# Patient Record
Sex: Male | Born: 1980 | Race: White | Hispanic: No | Marital: Married | Smoking: Never smoker
Health system: Southern US, Community
[De-identification: ages and names within clinical notes are randomized; demographics above are authoritative.]

## PROBLEM LIST (undated history)

## (undated) DIAGNOSIS — T4145XA Adverse effect of unspecified anesthetic, initial encounter: Secondary | ICD-10-CM

## (undated) DIAGNOSIS — Z8489 Family history of other specified conditions: Secondary | ICD-10-CM

## (undated) DIAGNOSIS — T8859XA Other complications of anesthesia, initial encounter: Secondary | ICD-10-CM

## (undated) DIAGNOSIS — S86019A Strain of unspecified Achilles tendon, initial encounter: Secondary | ICD-10-CM

## (undated) HISTORY — PX: FINGER SURGERY: SHX640

## (undated) HISTORY — PX: ANKLE RECONSTRUCTION: SHX1151

## (undated) HISTORY — PX: HAND LIGAMENT RECONSTRUCTION: SHX1726

## (undated) HISTORY — PX: SHOULDER ARTHROSCOPY: SHX128

---

## 1998-09-20 ENCOUNTER — Emergency Department (HOSPITAL_COMMUNITY): Admission: EM | Admit: 1998-09-20 | Discharge: 1998-09-20 | Payer: Self-pay | Admitting: Emergency Medicine

## 1998-09-20 ENCOUNTER — Emergency Department (HOSPITAL_COMMUNITY): Admission: EM | Admit: 1998-09-20 | Discharge: 1998-09-20 | Payer: Self-pay | Admitting: *Deleted

## 2002-06-21 ENCOUNTER — Ambulatory Visit (HOSPITAL_BASED_OUTPATIENT_CLINIC_OR_DEPARTMENT_OTHER): Admission: RE | Admit: 2002-06-21 | Discharge: 2002-06-21 | Payer: Self-pay | Admitting: Orthopedic Surgery

## 2004-09-06 ENCOUNTER — Encounter: Admission: RE | Admit: 2004-09-06 | Discharge: 2004-09-06 | Payer: Self-pay | Admitting: Emergency Medicine

## 2006-01-31 HISTORY — PX: SHOULDER ARTHROSCOPY WITH LABRAL REPAIR: SHX5691

## 2006-08-12 IMAGING — CR DG CHEST 2V
2 series · 2 of 2 positions shown · non-contrast
Comparison: Report dated 09/20/1998.

CLINICAL DATA: Right chest pain for the past 8 hours. Previous pneumothorax.

CHEST - 2 VIEW

[view not recorded (1 of 2)]
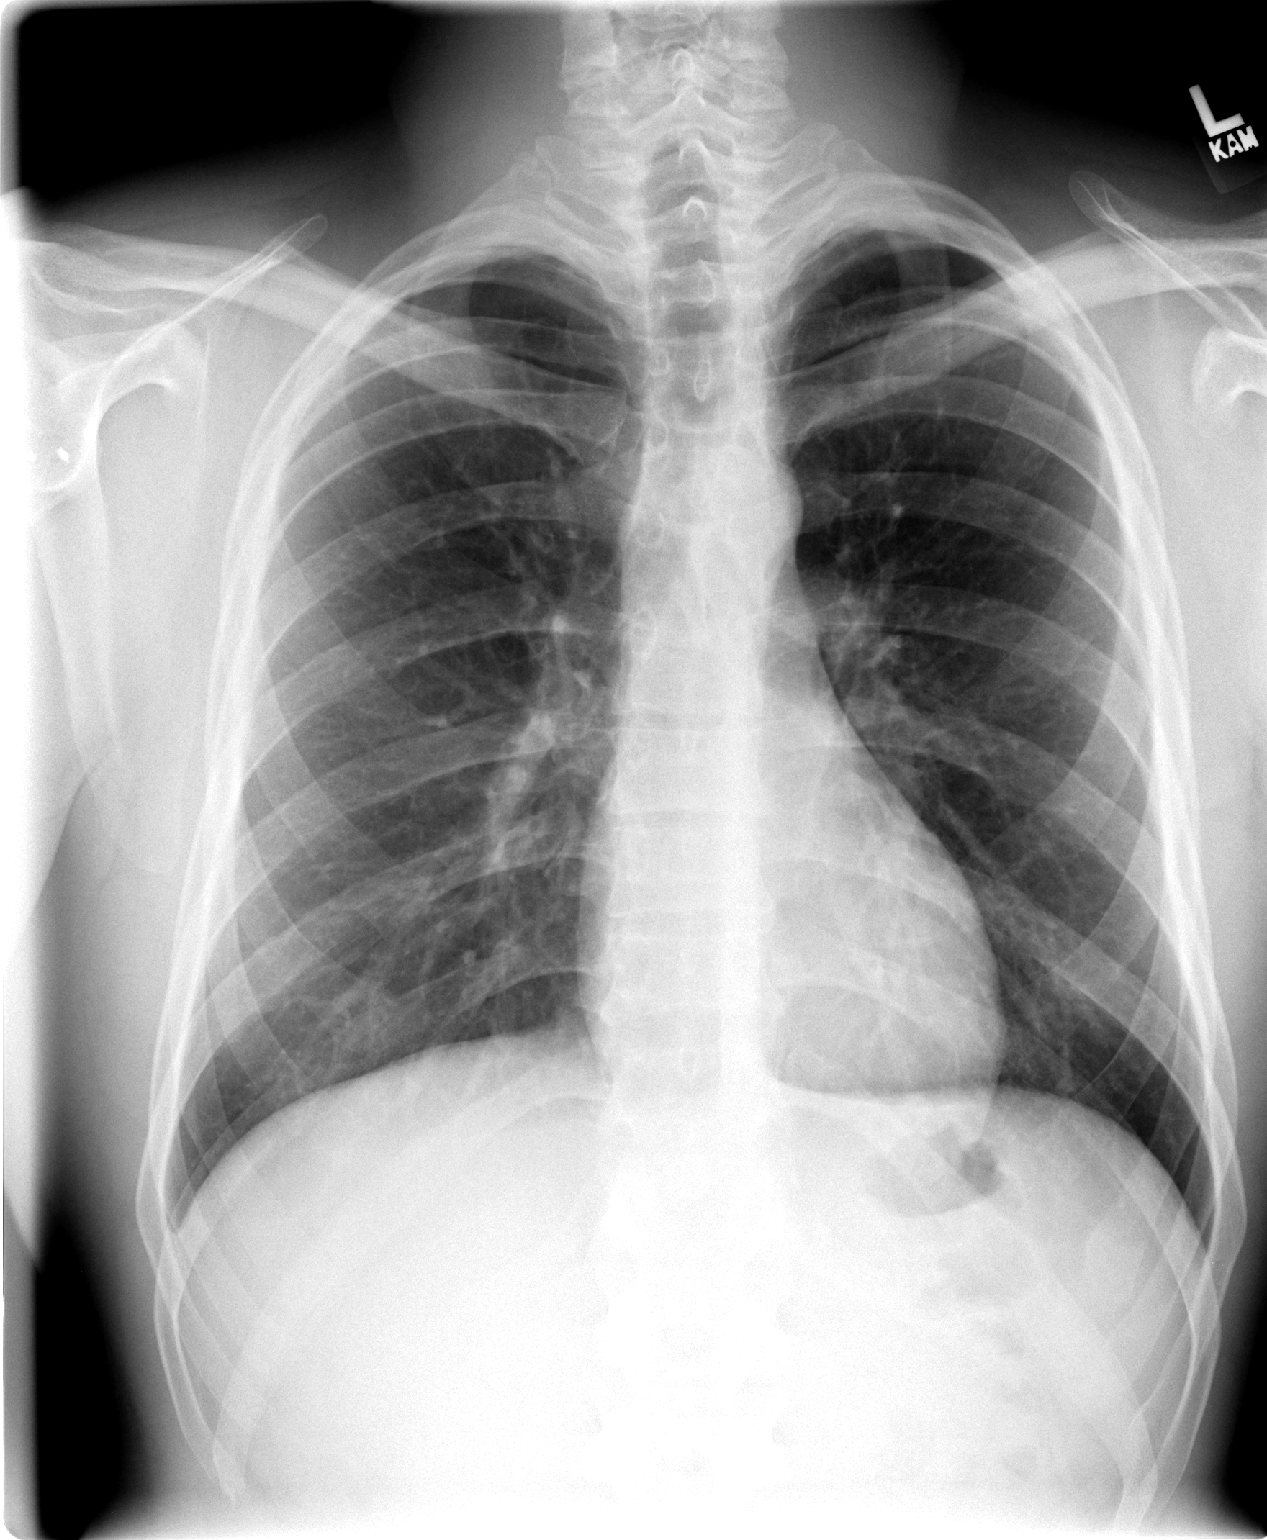

[view not recorded (2 of 2)]
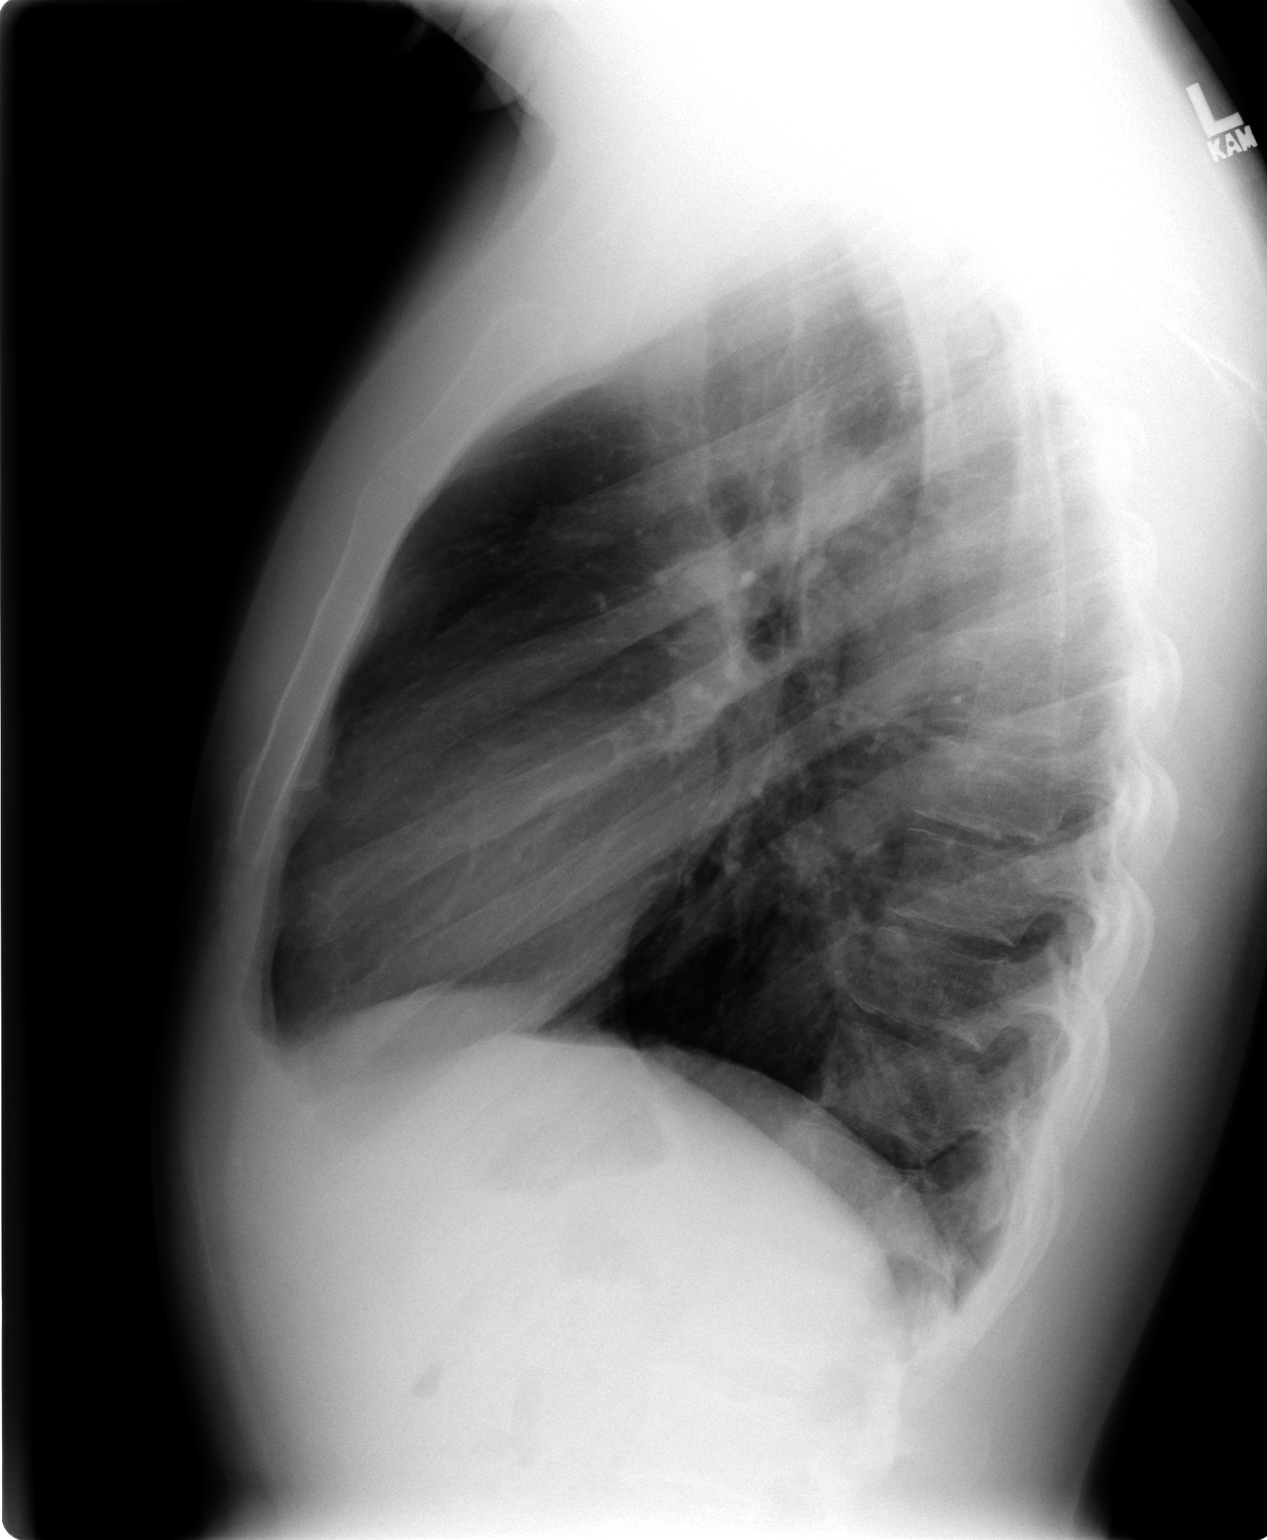

[2 of 2 positions shown; findings below may reference images not displayed]

FINDINGS: Normal sized heart. Clear lungs. No pneumothorax or pleural fluid.
Three small metallic densities or artifacts overlying the right shoulder.
Minimal scoliosis.

IMPRESSION

No acute abnormality.

## 2014-11-01 DIAGNOSIS — S86019A Strain of unspecified Achilles tendon, initial encounter: Secondary | ICD-10-CM

## 2014-11-01 HISTORY — DX: Strain of unspecified achilles tendon, initial encounter: S86.019A

## 2014-11-17 ENCOUNTER — Other Ambulatory Visit: Payer: Self-pay | Admitting: Orthopedic Surgery

## 2014-11-18 ENCOUNTER — Encounter (HOSPITAL_BASED_OUTPATIENT_CLINIC_OR_DEPARTMENT_OTHER): Payer: Self-pay | Admitting: *Deleted

## 2014-11-19 ENCOUNTER — Ambulatory Visit (HOSPITAL_BASED_OUTPATIENT_CLINIC_OR_DEPARTMENT_OTHER)
Admission: RE | Admit: 2014-11-19 | Discharge: 2014-11-19 | Disposition: A | Discharge status: Home / Self Care | Payer: Medicaid Other | Source: Ambulatory Visit | Attending: Orthopedic Surgery | Admitting: Orthopedic Surgery

## 2014-11-19 ENCOUNTER — Encounter (HOSPITAL_BASED_OUTPATIENT_CLINIC_OR_DEPARTMENT_OTHER)
Admission: RE | Disposition: A | Discharge status: Home / Self Care | Payer: Self-pay | Source: Ambulatory Visit | Attending: Orthopedic Surgery

## 2014-11-19 ENCOUNTER — Ambulatory Visit (HOSPITAL_BASED_OUTPATIENT_CLINIC_OR_DEPARTMENT_OTHER): Payer: Medicaid Other | Admitting: Anesthesiology

## 2014-11-19 ENCOUNTER — Encounter (HOSPITAL_BASED_OUTPATIENT_CLINIC_OR_DEPARTMENT_OTHER): Payer: Self-pay | Admitting: *Deleted

## 2014-11-19 DIAGNOSIS — M66871 Spontaneous rupture of other tendons, right ankle and foot: Secondary | ICD-10-CM | POA: Diagnosis not present

## 2014-11-19 HISTORY — DX: Strain of unspecified achilles tendon, initial encounter: S86.019A

## 2014-11-19 HISTORY — DX: Family history of other specified conditions: Z84.89

## 2014-11-19 HISTORY — DX: Other complications of anesthesia, initial encounter: T88.59XA

## 2014-11-19 HISTORY — PX: ACHILLES TENDON SURGERY: SHX542

## 2014-11-19 HISTORY — DX: Adverse effect of unspecified anesthetic, initial encounter: T41.45XA

## 2014-11-19 SURGERY — REPAIR, TENDON, ACHILLES
Anesthesia: General | Site: Ankle | Laterality: Right

## 2014-11-19 MED ORDER — CHLORHEXIDINE GLUCONATE 4 % EX LIQD: 60.0000 mL | Freq: Once | CUTANEOUS | Status: DC

## 2014-11-19 MED ORDER — GLYCOPYRROLATE 0.2 MG/ML IJ SOLN
0.2000 mg | Freq: Once | INTRAMUSCULAR | Status: AC | PRN
  Administered 2014-11-19: 0.2 mg via INTRAVENOUS

## 2014-11-19 MED ORDER — CEFAZOLIN SODIUM-DEXTROSE 2-3 GM-% IV SOLR
INTRAVENOUS | Status: AC
  Filled 2014-11-19: qty 50

## 2014-11-19 MED ORDER — FENTANYL CITRATE (PF) 100 MCG/2ML IJ SOLN
INTRAMUSCULAR | Status: AC
  Filled 2014-11-19: qty 4

## 2014-11-19 MED ORDER — PROPOFOL 10 MG/ML IV BOLUS
INTRAVENOUS | Status: DC | PRN
  Administered 2014-11-19: 200 mg via INTRAVENOUS

## 2014-11-19 MED ORDER — LACTATED RINGERS IV SOLN
INTRAVENOUS | Status: DC
  Administered 2014-11-19 (×2): via INTRAVENOUS

## 2014-11-19 MED ORDER — LIDOCAINE HCL (CARDIAC) 10 MG/ML IV SOLN
INTRAVENOUS | Status: DC | PRN
  Administered 2014-11-19: 40 mg via INTRAVENOUS

## 2014-11-19 MED ORDER — ONDANSETRON HCL 4 MG/2ML IJ SOLN: 4.0000 mg | Freq: Once | INTRAMUSCULAR | Status: DC | PRN

## 2014-11-19 MED ORDER — BUPIVACAINE-EPINEPHRINE (PF) 0.5% -1:200000 IJ SOLN
INTRAMUSCULAR | Status: DC | PRN
  Administered 2014-11-19: 25 mL

## 2014-11-19 MED ORDER — ONDANSETRON HCL 4 MG/2ML IJ SOLN
INTRAMUSCULAR | Status: DC | PRN
  Administered 2014-11-19: 4 mg via INTRAVENOUS

## 2014-11-19 MED ORDER — MIDAZOLAM HCL 2 MG/2ML IJ SOLN
INTRAMUSCULAR | Status: AC
  Filled 2014-11-19: qty 2

## 2014-11-19 MED ORDER — FENTANYL CITRATE (PF) 100 MCG/2ML IJ SOLN: 25.0000 ug | INTRAMUSCULAR | Status: DC | PRN

## 2014-11-19 MED ORDER — LIDOCAINE HCL (CARDIAC) 20 MG/ML IV SOLN
INTRAVENOUS | Status: AC
  Filled 2014-11-19: qty 5

## 2014-11-19 MED ORDER — CEFAZOLIN SODIUM-DEXTROSE 2-3 GM-% IV SOLR
2.0000 g | INTRAVENOUS | Status: AC
  Administered 2014-11-19: 2 g via INTRAVENOUS

## 2014-11-19 MED ORDER — SUCCINYLCHOLINE CHLORIDE 20 MG/ML IJ SOLN
INTRAMUSCULAR | Status: DC | PRN
  Administered 2014-11-19: 100 mg via INTRAVENOUS

## 2014-11-19 MED ORDER — FENTANYL CITRATE (PF) 100 MCG/2ML IJ SOLN
INTRAMUSCULAR | Status: AC
  Filled 2014-11-19: qty 2

## 2014-11-19 MED ORDER — MIDAZOLAM HCL 2 MG/2ML IJ SOLN
INTRAMUSCULAR | Status: AC
  Filled 2014-11-19: qty 4

## 2014-11-19 MED ORDER — ARTIFICIAL TEARS OP OINT
TOPICAL_OINTMENT | OPHTHALMIC | Status: AC
  Filled 2014-11-19: qty 3.5

## 2014-11-19 MED ORDER — FENTANYL CITRATE (PF) 100 MCG/2ML IJ SOLN
50.0000 ug | INTRAMUSCULAR | Status: AC | PRN
  Administered 2014-11-19: 25 ug via INTRAVENOUS
  Administered 2014-11-19: 100 ug via INTRAVENOUS
  Administered 2014-11-19: 25 ug via INTRAVENOUS
  Administered 2014-11-19: 100 ug via INTRAVENOUS

## 2014-11-19 MED ORDER — MIDAZOLAM HCL 2 MG/2ML IJ SOLN
1.0000 mg | INTRAMUSCULAR | Status: DC | PRN
  Administered 2014-11-19 (×2): 2 mg via INTRAVENOUS

## 2014-11-19 MED ORDER — CEFAZOLIN SODIUM 1-5 GM-% IV SOLN
INTRAVENOUS | Status: AC
  Filled 2014-11-19: qty 50

## 2014-11-19 MED ORDER — DEXAMETHASONE SODIUM PHOSPHATE 10 MG/ML IJ SOLN
INTRAMUSCULAR | Status: AC
  Filled 2014-11-19: qty 1

## 2014-11-19 MED ORDER — SCOPOLAMINE 1 MG/3DAYS TD PT72: 1.0000 | MEDICATED_PATCH | Freq: Once | TRANSDERMAL | Status: DC | PRN

## 2014-11-19 MED ORDER — OXYCODONE-ACETAMINOPHEN 5-325 MG PO TABS: 1.0000 | ORAL_TABLET | Freq: Four times a day (QID) | ORAL | Status: AC | PRN

## 2014-11-19 MED ORDER — CEFAZOLIN SODIUM 1-5 GM-% IV SOLN
1.0000 g | Freq: Once | INTRAVENOUS | Status: AC
  Administered 2014-11-19: 1 g via INTRAVENOUS

## 2014-11-19 MED ORDER — ONDANSETRON HCL 4 MG/2ML IJ SOLN
INTRAMUSCULAR | Status: AC
  Filled 2014-11-19: qty 2

## 2014-11-19 MED ORDER — DEXAMETHASONE SODIUM PHOSPHATE 4 MG/ML IJ SOLN
INTRAMUSCULAR | Status: DC | PRN
  Administered 2014-11-19: 10 mg via INTRAVENOUS

## 2014-11-19 MED ORDER — KETOROLAC TROMETHAMINE 30 MG/ML IJ SOLN
INTRAMUSCULAR | Status: AC
  Filled 2014-11-19: qty 1

## 2014-11-19 SURGICAL SUPPLY — 65 items
BANDAGE ELASTIC 4 VELCRO ST LF (GAUZE/BANDAGES/DRESSINGS) ×3 IMPLANT
BANDAGE ELASTIC 6 VELCRO ST LF (GAUZE/BANDAGES/DRESSINGS) ×3 IMPLANT
BANDAGE ESMARK 6X9 LF (GAUZE/BANDAGES/DRESSINGS) ×1 IMPLANT
BLADE SURG 10 STRL SS (BLADE) ×3 IMPLANT
BLADE SURG 15 STRL LF DISP TIS (BLADE) ×1 IMPLANT
BLADE SURG 15 STRL SS (BLADE) ×2
BNDG ESMARK 4X9 LF (GAUZE/BANDAGES/DRESSINGS) IMPLANT
BNDG ESMARK 6X9 LF (GAUZE/BANDAGES/DRESSINGS) ×3
COVER BACK TABLE 60X90IN (DRAPES) ×3 IMPLANT
DECANTER SPIKE VIAL GLASS SM (MISCELLANEOUS) IMPLANT
DRAPE EXTREMITY T 121X128X90 (DRAPE) ×3 IMPLANT
DRAPE U 20/CS (DRAPES) ×3 IMPLANT
DRAPE U-SHAPE 47X51 STRL (DRAPES) ×3 IMPLANT
DURAPREP 26ML APPLICATOR (WOUND CARE) ×3 IMPLANT
ELECT REM PT RETURN 9FT ADLT (ELECTROSURGICAL) ×3
ELECTRODE REM PT RTRN 9FT ADLT (ELECTROSURGICAL) ×1 IMPLANT
GAUZE SPONGE 4X4 12PLY STRL (GAUZE/BANDAGES/DRESSINGS) ×3 IMPLANT
GAUZE XEROFORM 1X8 LF (GAUZE/BANDAGES/DRESSINGS) ×3 IMPLANT
GLOVE BIO SURGEON STRL SZ 6.5 (GLOVE) ×4 IMPLANT
GLOVE BIO SURGEONS STRL SZ 6.5 (GLOVE) ×2
GLOVE BIOGEL PI IND STRL 7.0 (GLOVE) ×1 IMPLANT
GLOVE BIOGEL PI IND STRL 8 (GLOVE) ×2 IMPLANT
GLOVE BIOGEL PI INDICATOR 7.0 (GLOVE) ×2
GLOVE BIOGEL PI INDICATOR 8 (GLOVE) ×4
GLOVE ECLIPSE 7.5 STRL STRAW (GLOVE) ×6 IMPLANT
GOWN STRL REUS W/ TWL LRG LVL3 (GOWN DISPOSABLE) ×1 IMPLANT
GOWN STRL REUS W/ TWL XL LVL3 (GOWN DISPOSABLE) ×1 IMPLANT
GOWN STRL REUS W/TWL LRG LVL3 (GOWN DISPOSABLE) ×2
GOWN STRL REUS W/TWL XL LVL3 (GOWN DISPOSABLE) ×8 IMPLANT
NDL SUT 6 .5 CRC .975X.05 MAYO (NEEDLE) ×1 IMPLANT
NEEDLE HYPO 25X1 1.5 SAFETY (NEEDLE) IMPLANT
NEEDLE MAYO TAPER (NEEDLE) ×2
NS IRRIG 1000ML POUR BTL (IV SOLUTION) ×3 IMPLANT
PAD CAST 4YDX4 CTTN HI CHSV (CAST SUPPLIES) ×2 IMPLANT
PADDING CAST ABS 4INX4YD NS (CAST SUPPLIES) ×2
PADDING CAST ABS COTTON 4X4 ST (CAST SUPPLIES) ×1 IMPLANT
PADDING CAST COTTON 4X4 STRL (CAST SUPPLIES) ×4
PASSER SUT SWANSON 36MM LOOP (INSTRUMENTS) IMPLANT
PENCIL BUTTON HOLSTER BLD 10FT (ELECTRODE) ×3 IMPLANT
SPLINT FAST PLASTER 5X30 (CAST SUPPLIES) ×4
SPLINT FIBERGLASS 4X30 (CAST SUPPLIES) ×3 IMPLANT
SPLINT PLASTER CAST FAST 5X30 (CAST SUPPLIES) ×2 IMPLANT
SPONGE LAP 4X18 X RAY DECT (DISPOSABLE) ×3 IMPLANT
STOCKINETTE 6  STRL (DRAPES) ×2
STOCKINETTE 6 STRL (DRAPES) ×1 IMPLANT
SUCTION FRAZIER TIP 10 FR DISP (SUCTIONS) IMPLANT
SUT ETHIBOND 0 MO6 C/R (SUTURE) IMPLANT
SUT ETHIBOND 2 OS 4 DA (SUTURE) IMPLANT
SUT ETHILON 3 0 PS 1 (SUTURE) IMPLANT
SUT FIBERWIRE #2 38 T-5 BLUE (SUTURE) ×3
SUT FIBERWIRE #5 38 CONV NDL (SUTURE)
SUT VIC AB 2-0 CT1 27 (SUTURE) ×2
SUT VIC AB 2-0 CT1 TAPERPNT 27 (SUTURE) ×1 IMPLANT
SUT VIC AB 3-0 FS2 27 (SUTURE) ×3 IMPLANT
SUT VICRYL 4-0 PS2 18IN ABS (SUTURE) ×3 IMPLANT
SUTURE FIBERWR #2 38 T-5 BLUE (SUTURE) ×1 IMPLANT
SUTURE FIBERWR #5 38 CONV NDL (SUTURE) IMPLANT
SYR BULB 3OZ (MISCELLANEOUS) ×3 IMPLANT
SYR CONTROL 10ML LL (SYRINGE) IMPLANT
TOWEL OR 17X24 6PK STRL BLUE (TOWEL DISPOSABLE) ×6 IMPLANT
TOWEL OR NON WOVEN STRL DISP B (DISPOSABLE) ×3 IMPLANT
TUBE CONNECTING 20'X1/4 (TUBING)
TUBE CONNECTING 20X1/4 (TUBING) IMPLANT
UNDERPAD 30X30 (UNDERPADS AND DIAPERS) ×3 IMPLANT
YANKAUER SUCT BULB TIP NO VENT (SUCTIONS) IMPLANT

## 2014-11-19 NOTE — Anesthesia Preprocedure Evaluation (Signed)
Anesthesia Evaluation  Patient identified by MRN, date of birth, ID band Patient awake    Reviewed: Allergy & Precautions, NPO status , Patient's Chart, lab work & pertinent test results  History of Anesthesia Complications (+) history of anesthetic complications (hx of being violent coming out of anesthesia, same in his family)  Airway Mallampati: II  TM Distance: >3 FB Neck ROM: Full    Dental no notable dental hx. (+) Dental Advisory Given   Pulmonary neg pulmonary ROS,    Pulmonary exam normal breath sounds clear to auscultation       Cardiovascular negative cardio ROS Normal cardiovascular exam Rhythm:Regular Rate:Normal     Neuro/Psych negative neurological ROS  negative psych ROS   GI/Hepatic negative GI ROS, Neg liver ROS,   Endo/Other  negative endocrine ROS  Renal/GU negative Renal ROS  negative genitourinary   Musculoskeletal negative musculoskeletal ROS (+)   Abdominal   Peds negative pediatric ROS (+)  Hematology negative hematology ROS (+)   Anesthesia Other Findings   Reproductive/Obstetrics negative OB ROS                             Anesthesia Physical Anesthesia Plan  ASA: I  Anesthesia Plan: General   Post-op Pain Management: GA combined w/ Regional for post-op pain   Induction: Intravenous  Airway Management Planned: Oral ETT  Additional Equipment:   Intra-op Plan:   Post-operative Plan: Extubation in OR  Informed Consent: I have reviewed the patients History and Physical, chart, labs and discussed the procedure including the risks, benefits and alternatives for the proposed anesthesia with the patient or authorized representative who has indicated his/her understanding and acceptance.   Dental advisory given  Plan Discussed with: CRNA  Anesthesia Plan Comments:         Anesthesia Quick Evaluation

## 2014-11-19 NOTE — Transfer of Care (Signed)
Immediate Anesthesia Transfer of Care Note  Patient: Curtis Thomas  Procedure(s) Performed: Procedure(s): RIGHT ACHILLES TENDON REPAIR (Right)  Patient Location: PACU  Anesthesia Type:GA combined with regional for post-op pain  Level of Consciousness: sedated and patient cooperative  Airway & Oxygen Therapy: Patient Spontanous Breathing and Patient connected to face mask oxygen  Post-op Assessment: Report given to RN and Post -op Vital signs reviewed and stable  Post vital signs: Reviewed and stable  Last Vitals:  Filed Vitals:   11/19/14 1517  BP: 117/64  Pulse: 82  Temp:   Resp: 18    Complications: No apparent anesthesia complications

## 2014-11-19 NOTE — H&P (Signed)
  PREOPERATIVE H&P  Chief Complaint: r achilles pain  HPI: Curtis Thomas is a 34 y.o. male who presents for evaluation of r achilles pain. It has been present for 3 days and has been worsening. He has failed conservative measures. Pain is rated as moderate.  Past Medical History  Diagnosis Date  . Ruptured, tendon, Achilles 11/2014    right  . Complication of anesthesia     states is violent when coming out of anesthesia  . Family history of adverse reaction to anesthesia     maternal grandfather:  hx. of being violent when wakes up    Past Surgical History  Procedure Laterality Date  . Shoulder arthroscopy with labral repair Right 2008  . Shoulder arthroscopy Right   . Hand ligament reconstruction Right   . Finger surgery Right     middle finger  . Ankle reconstruction Left    Social History   Social History  . Marital Status: Married    Spouse Name: N/A  . Number of Children: N/A  . Years of Education: N/A   Social History Main Topics  . Smoking status: Never Smoker   . Smokeless tobacco: Never Used  . Alcohol Use: Yes     Comment: occasionally  . Drug Use: No  . Sexual Activity: Not Asked   Other Topics Concern  . None   Social History Narrative  . None   Family History  Problem Relation Age of Onset  . Anesthesia problems Maternal Grandfather     is violent when wakes up, per pt.   No Known Allergies Prior to Admission medications   Medication Sig Start Date End Date Taking? Authorizing Provider  BROCCOLI EXTRACT PO Take by mouth.   Yes Historical Provider, MD  HYDROcodone-acetaminophen (NORCO/VICODIN) 5-325 MG tablet Take 1 tablet by mouth every 6 (six) hours as needed for moderate pain.   Yes Historical Provider, MD     Positive ROS: none  All other systems have been reviewed and were otherwise negative with the exception of those mentioned in the HPI and as above.  Physical Exam: There were no vitals filed for this visit.  General: Alert, no  acute distress Cardiovascular: No pedal edema Respiratory: No cyanosis, no use of accessory musculature GI: No organomegaly, abdomen is soft and non-tender Skin: No lesions in the area of chief complaint Neurologic: Sensation intact distally Psychiatric: Patient is competent for consent with normal mood and affect Lymphatic: No axillary or cervical lymphadenopathy  MUSCULOSKELETAL: palp gap r achilles abnormal thompsons test  Assessment/Plan: ruptured achilles tendon right Plan for Procedure(s): RIGHT ACHILLES TENDON REPAIR  The risks benefits and alternatives were discussed with the patient including but not limited to the risks of nonoperative treatment, versus surgical intervention including infection, bleeding, nerve injury, malunion, nonunion, hardware prominence, hardware failure, need for hardware removal, blood clots, cardiopulmonary complications, morbidity, mortality, among others, and they were willing to proceed.  Predicted outcome is good, although there will be at least a six to nine month expected recovery.  William Laske L, MD 11/19/2014 7:03 AM

## 2014-11-19 NOTE — Discharge Instructions (Signed)
Elevate your leg as much as possible. Ambulate Non weight bearng with crutches   .Regional Anesthesia Blocks  1. Numbness or the inability to move the "blocked" extremity may last from 3-48 hours after placement. The length of time depends on the medication injected and your individual response to the medication. If the numbness is not going away after 48 hours, call your surgeon.  2. The extremity that is blocked will need to be protected until the numbness is gone and the  Strength has returned. Because you cannot feel it, you will need to take extra care to avoid injury. Because it may be weak, you may have difficulty moving it or using it. You may not know what position it is in without looking at it while the block is in effect.  3. For blocks in the legs and feet, returning to weight bearing and walking needs to be done carefully. You will need to wait until the numbness is entirely gone and the strength has returned. You should be able to move your leg and foot normally before you try and bear weight or walk. You will need someone to be with you when you first try to ensure you do not fall and possibly risk injury.  4. Bruising and tenderness at the needle site are common side effects and will resolve in a few days.  5. Persistent numbness or new problems with movement should be communicated to the surgeon or the [facility] [phone])/ [facility] [phone]).  Post Anesthesia Home Care Instructions  Activity: Get plenty of rest for the remainder of the day. A responsible adult should stay with you for 24 hours following the procedure.  For the next 24 hours, DO NOT: -Drive a car -Advertising copywriterperate machinery -Drink alcoholic beverages -Take any medication unless instructed by your physician -Make any legal decisions or sign important papers.  Meals: Start with liquid foods such as gelatin or soup. Progress to regular foods as tolerated. Avoid greasy, spicy,  heavy foods. If nausea and/or vomiting occur, drink only clear liquids until the nausea and/or vomiting subsides. Call your physician if vomiting continues.  Special Instructions/Symptoms: Your throat may feel dry or sore from the anesthesia or the breathing tube placed in your throat during surgery. If this causes discomfort, gargle with warm salt water. The discomfort should disappear within 24 hours.  If you had a scopolamine patch placed behind your ear for the management of post- operative nausea and/or vomiting:  1. The medication in the patch is effective for 72 hours, after which it should be removed.  Wrap patch in a tissue and discard in the trash. Wash hands thoroughly with soap and water. 2. You may remove the patch earlier than 72 hours if you experience unpleasant side effects which may include dry mouth, dizziness or visual disturbances. 3. Avoid touching the patch. Wash your hands with soap and water after contact with the patch.

## 2014-11-19 NOTE — Anesthesia Procedure Notes (Addendum)
Anesthesia Regional Block:  Popliteal block  Pre-Anesthetic Checklist: ,, timeout performed, Correct Patient, Correct Site, Correct Laterality, Correct Procedure, Correct Position, site marked, Risks and benefits discussed,  Surgical consent,  Pre-op evaluation,  At surgeon's request and post-op pain management  Laterality: Right  Prep: chloraprep       Needles:  Injection technique: Single-shot  Needle Type: Stimiplex      Needle Gauge: 21 and 21 G    Additional Needles:  Procedures: ultrasound guided (picture in chart) Popliteal block Narrative:  Injection made incrementally with aspirations every 5 mL.  Performed by: Personally  Anesthesiologist: JUDD, MARY  Additional Notes: Risks, benefits and alternative to block explained extensively.  Patient tolerated procedure well, without complications.   Procedure Name: Intubation Date/Time: 11/19/2014 1:59 PM Performed by: Gar GibbonKEETON, Tjay Velazquez S Pre-anesthesia Checklist: Patient identified, Emergency Drugs available, Suction available and Patient being monitored Patient Re-evaluated:Patient Re-evaluated prior to inductionOxygen Delivery Method: Circle System Utilized Preoxygenation: Pre-oxygenation with 100% oxygen Intubation Type: IV induction Ventilation: Mask ventilation without difficulty Laryngoscope Size: Miller and 3 Grade View: Grade II Tube type: Oral Number of attempts: 1 Airway Equipment and Method: Stylet and Oral airway Placement Confirmation: ETT inserted through vocal cords under direct vision,  positive ETCO2 and breath sounds checked- equal and bilateral Secured at: 22 cm Tube secured with: Tape Dental Injury: Teeth and Oropharynx as per pre-operative assessment

## 2014-11-19 NOTE — Brief Op Note (Signed)
11/19/2014  4:58 PM  PATIENT:  Curtis Thomas  34 y.o. male  PRE-OPERATIVE DIAGNOSIS:  ruptured achilles tendon right  POST-OPERATIVE DIAGNOSIS:  ruptured achilles tendon right  PROCEDURE:  Procedure(s): RIGHT ACHILLES TENDON REPAIR (Right)  SURGEON:  Surgeon(s) and Role:    * Jodi GeraldsJohn Aleeya Veitch, MD - Primary  PHYSICIAN ASSISTANT:   ASSISTANTS: bethune   ANESTHESIA:   general  EBL:  Total I/O In: 1222 [P.O.:222; I.V.:1000] Out: -   BLOOD ADMINISTERED:none  DRAINS: none   LOCAL MEDICATIONS USED:  NONE  SPECIMEN:  No Specimen  DISPOSITION OF SPECIMEN:  N/A  COUNTS:  YES  TOURNIQUET:   Total Tourniquet Time Documented: Thigh (Left) - 47 minutes Total: Thigh (Left) - 47 minutes   DICTATION: .Other Dictation: Dictation Number [id number]  PLAN OF CARE: Discharge to home after PACU  PATIENT DISPOSITION:  PACU - hemodynamically stable.   Delay start of Pharmacological VTE agent (>24hrs) due to surgical blood loss or risk of bleeding: no

## 2014-11-19 NOTE — Anesthesia Postprocedure Evaluation (Signed)
  Anesthesia Post-op Note  Patient: Curtis Thomas  Procedure(s) Performed: Procedure(s) (LRB): RIGHT ACHILLES TENDON REPAIR (Right)  Patient Location: PACU  Anesthesia Type: General  Level of Consciousness: awake and alert   Airway and Oxygen Therapy: Patient Spontanous Breathing  Post-op Pain: mild  Post-op Assessment: Post-op Vital signs reviewed, Patient's Cardiovascular Status Stable, Respiratory Function Stable, Patent Airway and No signs of Nausea or vomiting  Last Vitals:  Filed Vitals:   11/19/14 1530  BP: 116/65  Pulse: 66  Temp:   Resp: 17    Post-op Vital Signs: stable   Complications: No apparent anesthesia complications

## 2014-11-19 NOTE — Progress Notes (Signed)
Assisted Dr. M. Judd with right, ultrasound guided, popliteal block. Side rails up, monitors on throughout procedure. See vital signs in flow sheet. Tolerated Procedure well. 

## 2014-11-20 ENCOUNTER — Encounter (HOSPITAL_BASED_OUTPATIENT_CLINIC_OR_DEPARTMENT_OTHER): Payer: Self-pay | Admitting: Orthopedic Surgery

## 2014-11-20 NOTE — Op Note (Signed)
NAMJolyn Nap:  Kaatz, Kavon               ACCOUNT NO.:  [id number]  MEDICAL RECORD NO.:  [id number]  LOCATION:                               FACILITY:  [facility]  PHYSICIAN:  Harvie JuniorJohn L. Ilyse Tremain, M.D.   DATE OF BIRTH:  [birth year]  DATE OF PROCEDURE:  11/19/2014 DATE OF DISCHARGE:  11/19/2014                              OPERATIVE REPORT   PREOPERATIVE DIAGNOSIS:  Achilles tendon rupture, right.  POSTOPERATIVE DIAGNOSIS:  Achilles tendon rupture, right.  PROCEDURE:  Right Achilles tendon repair with 2-0 FiberWire core stitches, a circumferential 4-0 nylon, circumferential stitch, and then closure of the peritenon and skin.  SURGEON:  Harvie JuniorJohn L. Chapel Silverthorn, M.D.  ASSISTANT:  Marshia LyJames Bethune, P.A.  ANESTHESIA:  General.  BRIEF HISTORY:  Mr. 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  He was noted in the office to have negative Thompson's test.  He had a palpable defect where he had an Achilles tendon rupture.  We talked about treatment options including operative versus non-operative.  He is taken to the operating room for operative repair given his young age and high activity lifestyle.  PROCEDURE IN DETAIL:  The patient was taken to the operating room. After adequate anesthesia obtained with general anesthetic, the patient was placed prone on the operating table.  The right leg was then prepped and draped in usual sterile fashion.  Following this, the leg was exsanguinated.  Blood pressure inflated to 300 mmHg.  Following this, a medial incision was made into subcutaneous tissue down to the level of peritenon which was divided.  There was a large epitenon which we divided and then carefully through a Krackow-type stitch to the proximal piece locking all the way proximally and then through a Krackow stitch the distal piece locking it distally.  I then checked the tension, had excellent tension at the repair site.  We passed these down through  the tendon and tied the stitches on the what will be the anterior aspect of the tendon.  We then did oversew some of the tendinous portion with 4-0 nylon suture; and once this was done, we closed the epitenon with 4-0 Vicryl.  We closed the peritenon with 3-0 Vicryl, and the skin with nylon interrupted sutures. Sterile compressive dressing was applied, and the patient was placed in gravity equinus with a posterior splint.  He was then taken to the recovery and was noted to be in satisfactory condition.  Estimated blood loss for the procedure was minimal.     Harvie JuniorJohn L. Shoichi Mielke, M.D.     Ranae PlumberJLG/MEDQ  D:  11/19/2014  T:  11/19/2014  Job:  [id number]

## 2015-02-05 ENCOUNTER — Ambulatory Visit: Payer: Medicaid Other | Attending: Orthopedic Surgery | Admitting: Physical Therapy

## 2015-02-05 DIAGNOSIS — M6281 Muscle weakness (generalized): Secondary | ICD-10-CM | POA: Diagnosis present

## 2015-02-05 DIAGNOSIS — R531 Weakness: Secondary | ICD-10-CM

## 2015-02-05 DIAGNOSIS — M25673 Stiffness of unspecified ankle, not elsewhere classified: Secondary | ICD-10-CM

## 2015-02-05 DIAGNOSIS — R269 Unspecified abnormalities of gait and mobility: Secondary | ICD-10-CM

## 2015-02-05 DIAGNOSIS — R29898 Other symptoms and signs involving the musculoskeletal system: Secondary | ICD-10-CM | POA: Diagnosis present

## 2015-02-05 DIAGNOSIS — Z9889 Other specified postprocedural states: Secondary | ICD-10-CM

## 2015-02-05 NOTE — Therapy (Signed)
[facility] [address] [place], [place], [address] Phone: [phone]   Fax:  [phone]  Physical Therapy Evaluation  Patient Details  Name: Curtis Thomas MRN: [id number] Date of Birth: 10-03-1980 Referring Provider: Milly JakobJohn Lee Graves, MD  Encounter Date: 02/05/2015      PT End of Session - 02/05/15 0914    Visit Number 1   Number of Visits 3   Date for PT Re-Evaluation 04/02/15   Authorization Type medicaid   Authorization Time Period awaiting authorization   PT Start Time 0801   PT Stop Time 0847   PT Time Calculation (min) 46 min   Activity Tolerance Patient tolerated treatment well;No increased pain   Behavior During Therapy [facility] for tasks assessed/performed      Past Medical History  Diagnosis Date  . Ruptured, tendon, Achilles 11/2014    right  . Complication of anesthesia     states is violent when coming out of anesthesia  . Family history of adverse reaction to anesthesia     maternal grandfather:  hx. of being violent when wakes up     Past Surgical History  Procedure Laterality Date  . Shoulder arthroscopy with labral repair Right 2008  . Shoulder arthroscopy Right   . Hand ligament reconstruction Right   . Finger surgery Right     middle finger  . Ankle reconstruction Left   . Achilles tendon surgery Right 11/19/2014    Procedure: RIGHT ACHILLES TENDON REPAIR;  Surgeon: Jodi GeraldsJohn Graves, MD;  Location: [facility];  Service: Orthopedics;  Laterality: Right;    There were no vitals filed for this visit.  Visit Diagnosis:  S/P Achilles tendon repair - Plan: PT plan of care cert/re-cert  Decreased range of motion of ankle - Plan: PT plan of care cert/re-cert  Decreased strength - Plan: PT plan of care cert/re-cert  Abnormality of gait - Plan: PT plan of care cert/re-cert      Subjective Assessment - 02/05/15 0808    Subjective Patient reports playing for [employer] and at the  first practice he threw a pass and felt a pop in the back of the rt achilles. He was diagnosed with an rt achilles tear and ultimately underwent surgery on 11/19/14. He states that has been in a boot until  about 3 weeks ago.   Limitations Other (comment)  compensating with walking, can't run or workout.   How long can you sit comfortably? unlimited    How long can you stand comfortably? unlimited   How long can you walk comfortably? unlimited   Patient Stated Goals Get the ankle as strong and stable as possible.   Currently in Pain? No/denies  will get sore when up too long.            [facility] Assessment - 02/05/15 0001    Assessment   Medical Diagnosis Rt achilles   Referring Provider Milly JakobJohn Lee Graves, MD   Onset Date/Surgical Date 11/19/14   Next MD Visit 02/05/15   Prior Therapy none related to this injury   Precautions   Precautions Other (comment)  gradual activity progression   Required Braces or Orthoses Other Brace/Splint  ankle brace when active   Restrictions   Weight Bearing Restrictions No   Balance Screen   Has the patient fallen in the past 6 months No   Has the patient had a decrease in activity level because of a fear of falling?  No   Is  the patient reluctant to leave their home because of a fear of falling?  No   Home Tourist information centre manager residence   Living Arrangements Spouse/significant other   Prior Function   Level of Independence Independent   Cognition   Overall Cognitive Status Within Functional Limits for tasks assessed   Observation/Other Assessments   Observations Well healed incision at rt achilles, area of thickened tissue around achilles region. No erythema noted.    Sensation   Light Touch Appears Intact   AROM   Right Ankle Dorsiflexion 4   Right Ankle Plantar Flexion 30   Right Ankle Inversion --  WFL   Right Ankle Eversion --  [facility]   Strength   Right Ankle Dorsiflexion 4/5   Right Ankle Plantar Flexion 4-/5    Right Ankle Inversion 4/5   Right Ankle Eversion 4/5   Palpation   Palpation comment no tenderness along achilles    Ambulation/Gait   Gait Comments decreased pushoff on Rt at end stance/early swing, decreased heel strike with initial contact.                            PT Education - 02/05/15 0913    Education provided Yes   Education Details HEP, activity progression, general POC   Person(s) Educated Patient   Methods Explanation;Demonstration;Tactile cues;Verbal cues   Comprehension Verbalized understanding;Returned demonstration          PT Short Term Goals - 02/05/15 1610    PT SHORT TERM GOAL #1   Title Patient to be independent with dynamic HEP for ROM and strengtening.    Time 2   Period Weeks   Status New   PT SHORT TERM GOAL #2   Title Patient is to demon 10 degrees of rt ankle df. for gait mechanics.   Time 2   Period Weeks   Status New           PT Long Term Goals - 02/05/15 9604    PT LONG TERM GOAL #1   Title Patient is to be independent with HEP with dynamic home an gym based exercises   Time 6   Period Weeks   Status New   PT LONG TERM GOAL #2   Title Patient is to demo 15 degrees of rt knee dorsiflexion for running mechanics.   Time 6   Period Weeks   Status New   PT LONG TERM GOAL #3   Title Patient to demo 5/5 strength with rt ankle inversion/eversion for stability on uneven surfaces.    Time 6   Period Weeks   Status New   PT LONG TERM GOAL #4   Title Patient is to be able to throw a football including rt foot plant and push for work related tasks.    Time 6   Period Weeks   Status New               Plan - 02/05/15 0915    Clinical Impression Statement Patient is a 35 y.o. male who sustained a rt achilles tear while playing football for [employer]. Following the tear he underwent an achilles repair on 11/19/14. Since then he was in a boot and he states he was just released from that about 3 weeks ago. He is now  being referred to OPPT for further rehabiliations needs. At this time the patient is presenting with decreased rt ankle ROM, strength, altered gait mechanics  and overall decreased function. Currently he is appropriate for ongoing PT services. The patient is motivated regarding his recoveray and will do well with HEP and progression as needed.  Patient evaluation classified at low complexity.   Pt will benefit from skilled therapeutic intervention in order to improve on the following deficits Abnormal gait;Decreased scar mobility;Decreased range of motion;Difficulty walking;Decreased strength   PT Frequency 1x / week   PT Duration 8 weeks   PT Treatment/Interventions Cryotherapy;Electrical Stimulation;Iontophoresis 4mg /ml Dexamethasone;Moist Heat;Ultrasound;Patient/family education;Balance training;Therapeutic exercise;Therapeutic activities;Functional mobility training;Stair training;Gait training;Manual techniques   PT Next Visit Plan Progress HEP to include more fuctional positions, squats, lunges, stepups, ellipitical (on/off toes)   PT Home Exercise Plan as above   Consulted and Agree with Plan of Care Patient         Problem List There are no active problems to display for this patient.   Delton See, PT 02/05/2015, 9:33 AM  [facility] [address] [place], [place], [address] Phone: [phone]   Fax:  [phone]  Name: Curtis Thomas MRN: [id number] Date of Birth: 1980-07-10

## 2015-02-05 NOTE — Patient Instructions (Signed)
HEP  Strap/standing  Calf stretch - 30-60 second holds X5 reps 3Xday T-band ankle dorsiflexion, plantarflexion, eversion, inversion - 3Xday 1X10, blue band Ambulation with pushoff on rt and heel strike. To progress to 20 feet ambulation with increased speed walking.   Patient to stop any activity that increases his discomfort. Patient declined handout for reference.

## 2015-02-19 ENCOUNTER — Ambulatory Visit: Payer: Medicaid Other

## 2015-02-19 DIAGNOSIS — R269 Unspecified abnormalities of gait and mobility: Secondary | ICD-10-CM

## 2015-02-19 DIAGNOSIS — Z9889 Other specified postprocedural states: Secondary | ICD-10-CM | POA: Diagnosis not present

## 2015-02-19 DIAGNOSIS — M25673 Stiffness of unspecified ankle, not elsewhere classified: Secondary | ICD-10-CM

## 2015-02-19 DIAGNOSIS — R531 Weakness: Secondary | ICD-10-CM

## 2015-02-19 NOTE — Therapy (Signed)
[facility] [address] [place], [place], [address] Phone: [phone]   Fax:  [phone]  Physical Therapy Treatment  Patient Details  Name: Curtis Thomas MRN: [id number] Date of Birth: 06-26-1980 Referring Provider: Milly Jakob, MD  Encounter Date: 02/19/2015      PT End of Session - 02/19/15 0954    Visit Number 2   Number of Visits 4   Date for PT Re-Evaluation 04/02/15   Authorization Type medicaid   PT Start Time 0804   PT Stop Time 0847   PT Time Calculation (min) 43 min   Activity Tolerance Patient tolerated treatment well   Behavior During Therapy [facility] for tasks assessed/performed      Past Medical History  Diagnosis Date  . Ruptured, tendon, Achilles 11/2014    right  . Complication of anesthesia     states is violent when coming out of anesthesia  . Family history of adverse reaction to anesthesia     maternal grandfather:  hx. of being violent when wakes up     Past Surgical History  Procedure Laterality Date  . Shoulder arthroscopy with labral repair Right 2008  . Shoulder arthroscopy Right   . Hand ligament reconstruction Right   . Finger surgery Right     middle finger  . Ankle reconstruction Left   . Achilles tendon surgery Right 11/19/2014    Procedure: RIGHT ACHILLES TENDON REPAIR;  Surgeon: Jodi Geralds, MD;  Location: [facility];  Service: Orthopedics;  Laterality: Right;    There were no vitals filed for this visit.  Visit Diagnosis:  S/P Achilles tendon repair  Decreased range of motion of ankle  Decreased strength  Abnormality of gait      Subjective Assessment - 02/19/15 0806    Subjective No pain . Doing HEP and some stuff at gym. Body weight squats and calf raises assisted  and aerobic machines,    Currently in Pain? No/denies            [facility] PT Assessment - 02/19/15 0840    AROM   Right Ankle Dorsiflexion 12   Right Ankle Plantar Flexion 35                      [facility] Adult PT Treatment/Exercise - 02/19/15 0807    Neuro Re-ed    Neuro Re-ed Details  Worked on SLS with ball toss on RT leg to rebounder. 40 reps  We discussed making more callenging with lateral ball tosses in gym. Stand with foot RT on foam and LT foot on mat  with lift  15 pounds x 15  and did chops at cable machine  stand on RT foot. Step ups to bosu ball x 15. Also worked on movements with throwing football with weight shift and band to resist throwing motion   Knee/Hip Exercises: Standing   Forward Step Up Step Height: 8";20 reps   Step Down Step Height: 4";20 reps   Step Down Limitations after step up   Functional Squat 15 reps   Functional Squat Limitations with side steps x 3 RT and LT     SLS with Vectors Stand RT leg with pillow care , cued to keep weight over R T foot.    Ankle Exercises: Aerobic   Stationary Bike L4 6 min   Elliptical Stepper 5 min L3 short steps   Ankle Exercises: Stretches   Plantar Fascia Stretch 2 reps;20 seconds  Cued to  try leg on mat and  sit on foot + push calcaneus dow   Soleus Stretch --  this was done with much of NMR exer                PT Education - 02/19/15 0953    Education provided Yes   Education Details Balance and stretching activity which he did not need handout as he ahs done some of these before and will need gym equipment as in Bosu ball   Person(s) Educated Patient   Methods Explanation;Demonstration;Verbal cues;Handout   Comprehension Returned demonstration;Verbalized understanding          PT Short Term Goals - 02/05/15 0981    PT SHORT TERM GOAL #1   Title Patient to be independent with dynamic HEP for ROM and strengtening.    Baseline not established   Time 2   Period Weeks   Status New   PT SHORT TERM GOAL #2   Title Patient is to demon 10 degrees of rt ankle df. for gait mechanics.   Baseline 4 degrees   Time 2   Period Weeks   Status New           PT Long Term  Goals - 02/05/15 1914    PT LONG TERM GOAL #1   Title Patient is to be independent with HEP with dynamic home an gym based exercises   Baseline not established   Time 6   Period Weeks   Status New   PT LONG TERM GOAL #2   Title Patient is to demo 15 degrees of rt knee dorsiflexion for running mechanics.   Baseline 4 degrees   Time 6   Period Weeks   Status New   PT LONG TERM GOAL #3   Title Patient to demo 5/5 strength with rt ankle inversion/eversion for stability on uneven surfaces.    Baseline 4/5 inversion, 4/5 eversion   Time 6   Period Weeks   Status New   PT LONG TERM GOAL #4   Title Patient is to be able to throw a football including rt foot plant and push for work related tasks.    Baseline unable to tolerate currently   Time 6   Period Weeks   Status New               Plan - 02/19/15 0955    Clinical Impression Statement No pain post session. He did well with all exercises.  If he is doing well next visit will start some bilateral hopping on soft surface/trampoline if it seems appropriate. Will do more lunges /squats   PT Next Visit Plan Progress HEP to include more fuctional positions, squats, lunges, stepups, ellipitical (on/off toes)   Consulted and Agree with Plan of Care Patient        Problem List There are no active problems to display for this patient.   Caprice Red PT 02/19/2015, 10:01 AM  [facility] [address] [place], [place], [address] Phone: [phone]   Fax:  [phone]  Name: Sonny Anthes MRN: [id number] Date of Birth: 01-12-1981

## 2015-02-22 DIAGNOSIS — Z9889 Other specified postprocedural states: Secondary | ICD-10-CM | POA: Diagnosis present

## 2015-02-22 DIAGNOSIS — R29898 Other symptoms and signs involving the musculoskeletal system: Secondary | ICD-10-CM | POA: Diagnosis present

## 2015-02-22 DIAGNOSIS — M6281 Muscle weakness (generalized): Secondary | ICD-10-CM | POA: Diagnosis present

## 2015-02-22 DIAGNOSIS — R269 Unspecified abnormalities of gait and mobility: Secondary | ICD-10-CM | POA: Diagnosis present

## 2015-02-25 ENCOUNTER — Ambulatory Visit: Payer: Medicaid Other

## 2015-02-25 DIAGNOSIS — Z9889 Other specified postprocedural states: Secondary | ICD-10-CM | POA: Diagnosis not present

## 2015-02-25 DIAGNOSIS — R531 Weakness: Secondary | ICD-10-CM

## 2015-02-25 DIAGNOSIS — M25673 Stiffness of unspecified ankle, not elsewhere classified: Secondary | ICD-10-CM

## 2015-02-25 DIAGNOSIS — R269 Unspecified abnormalities of gait and mobility: Secondary | ICD-10-CM

## 2015-02-25 NOTE — Therapy (Addendum)
[facility] [address] [place], [place], [address] Phone: [phone]   Fax:  [phone]  Physical Therapy Treatment  Patient Details  Name: Curtis Thomas MRN: [id number] Date of Birth: 1980/08/07 Referring Provider: Milly Jakob, MD  Encounter Date: 02/25/2015      PT End of Session - 02/25/15 0756    Visit Number 3   Number of Visits 4   Date for PT Re-Evaluation 04/02/15   Authorization Type medicaid   PT Start Time 0750   PT Stop Time 0845   PT Time Calculation (min) 55 min   Activity Tolerance Patient tolerated treatment well   Behavior During Therapy [facility] for tasks assessed/performed      Past Medical History  Diagnosis Date  . Ruptured, tendon, Achilles 11/2014    right  . Complication of anesthesia     states is violent when coming out of anesthesia  . Family history of adverse reaction to anesthesia     maternal grandfather:  hx. of being violent when wakes up     Past Surgical History  Procedure Laterality Date  . Shoulder arthroscopy with labral repair Right 2008  . Shoulder arthroscopy Right   . Hand ligament reconstruction Right   . Finger surgery Right     middle finger  . Ankle reconstruction Left   . Achilles tendon surgery Right 11/19/2014    Procedure: RIGHT ACHILLES TENDON REPAIR;  Surgeon: Curtis Geralds, MD;  Location: [facility];  Service: Orthopedics;  Laterality: Right;    There were no vitals filed for this visit.  Visit Diagnosis:  S/P Achilles tendon repair  Decreased range of motion of ankle  Decreased strength  Abnormality of gait      Subjective Assessment - 02/25/15 0756    Subjective Sore for a few days after last visit but good now.    Currently in Pain? No/denies         Active DF to 15 degrees and PF to 37 degrees                [facility] Adult PT Treatment/Exercise - 02/25/15 0753    Ambulation/Gait   Gait Comments Gait patter n  essentioally normal with no pain   Neuro Re-ed    Neuro Re-ed Details  Baps L2 x 15 clock and counter cloc, sit fit same , Bosu x10 each way , single leg stand on Thera pad  green x 15 RT and LT pull 10 pounds with LT foot on toe.  then pully with belt at pelvis  x 8 forward /back /RT and LT sideway with 3 plates then with 2 plates face LT with fotwork for throwing football drop back and face LT with push off for throwing mechanics    Knee/Hip Exercises: Standing   Forward Step Up Right;1 set;Hand Hold: 1;15 reps   Forward Step Up Limitations 11 inch step   Step Down Right;1 set;Hand Hold: 1;15 reps   Step Down Limitations 11 inch step   Ankle Exercises: Aerobic   Elliptical L6 ramp 10 8 min   Tread Mill Stepper   Ankle Exercises: Standing   Other Standing Ankle Exercises RT foot on step and on floor with push from RT foot x15 each with concentration on control and eccentric control                  PT Short Term Goals - 02/25/15 0758    PT SHORT TERM GOAL #1  Title Patient to be independent with dynamic HEP for ROM and strengtening.    Status Achieved   PT SHORT TERM GOAL #2   Title Patient is to demon 10 degrees of rt ankle df. for gait mechanics.   Status On-going           PT Long Term Goals - 02/05/15 1610    PT LONG TERM GOAL #1   Title Patient is to be independent with HEP with dynamic home an gym based exercises   Baseline not established   Time 6   Period Weeks   Status New   PT LONG TERM GOAL #2   Title Patient is to demo 15 degrees of rt knee dorsiflexion for running mechanics.   Baseline 4 degrees   Time 6   Period Weeks   Status New   PT LONG TERM GOAL #3   Title Patient to demo 5/5 strength with rt ankle inversion/eversion for stability on uneven surfaces.    Baseline 4/5 inversion, 4/5 eversion   Time 6   Period Weeks   Status New   PT LONG TERM GOAL #4   Title Patient is to be able to throw a football including rt foot plant and push for  work related tasks.    Baseline unable to tolerate currently   Time 6   Period Weeks   Status New               Plan - 02/25/15 0756    Clinical Impression Statement Mr Curtis Thomas continues to progress but may not be ready for jogging , hoipping as he was sore after last visit that was mostly balance and stability exercises. See how he does after today and add this if appropriate   PT Next Visit Plan Progress HEP to include more fuctional positions, squats, lunges, stepups, ellipitical , (on/off toes)   Consulted and Agree with Plan of Care Patient        Problem List There are no active problems to display for this patient.   Caprice Red PT 02/25/2015, 9:03 AM  [facility] [address] [place], [place], [address] Phone: [phone]   Fax:  [phone]  Name: Curtis Thomas MRN: [id number] Date of Birth: 1980/05/29

## 2015-03-02 ENCOUNTER — Ambulatory Visit: Payer: Medicaid Other

## 2015-03-02 DIAGNOSIS — R531 Weakness: Secondary | ICD-10-CM

## 2015-03-02 DIAGNOSIS — M25673 Stiffness of unspecified ankle, not elsewhere classified: Secondary | ICD-10-CM

## 2015-03-02 DIAGNOSIS — Z9889 Other specified postprocedural states: Secondary | ICD-10-CM

## 2015-03-02 NOTE — Patient Instructions (Signed)
We discussed progression of activity and need to  continue with ROM exercise which was more evident when he did the jumping on the reformer with less DF on RT/    He reportedhe understood to progress gradually  To minimize possibility of tendonitis slowing progress

## 2015-03-02 NOTE — Therapy (Addendum)
[place], [place], [address] Phone: [phone]   Fax:  [phone]  Physical Therapy Treatment  Patient Details  Name: Curtis Thomas MRN: [id number] Date of Birth: 09/03/1980 Referring Provider: Lowella Petties, MD  Encounter Date: 03/02/2015      PT End of Session - 03/02/15 0843    Visit Number 4   Number of Visits 4   PT Start Time 0750   PT Stop Time 0840   PT Time Calculation (min) 50 min   Activity Tolerance Patient tolerated treatment well   Behavior During Therapy [facility] for tasks assessed/performed      Past Medical History  Diagnosis Date  . Ruptured, tendon, Achilles 11/2014    right  . Complication of anesthesia     states is violent when coming out of anesthesia  . Family history of adverse reaction to anesthesia     maternal grandfather:  hx. of being violent when wakes up     Past Surgical History  Procedure Laterality Date  . Shoulder arthroscopy with labral repair Right 2008  . Shoulder arthroscopy Right   . Hand ligament reconstruction Right   . Finger surgery Right     middle finger  . Ankle reconstruction Left   . Achilles tendon surgery Right 11/19/2014    Procedure: RIGHT ACHILLES TENDON REPAIR;  Surgeon: Dorna Leitz, MD;  Location: [facility];  Service: Orthopedics;  Laterality: Right;    There were no vitals filed for this visit.  Visit Diagnosis:  S/P Achilles tendon repair  Decreased range of motion of ankle  Decreased strength      Subjective Assessment - 03/02/15 0848    Patient is accompained by: Family member            [facility] PT Assessment - 03/02/15 0853    AROM   Right Ankle Dorsiflexion 12   Right Ankle Plantar Flexion 35   Strength   Right Ankle Dorsiflexion 5/5   Right Ankle Plantar Flexion 4-/5   Right Ankle Inversion 5/5                     [facility] Adult PT Treatment/Exercise - 03/02/15 0804    Neuro Re-ed     Neuro Re-ed Details  Baps L3 x 15 clock and counter cloc,  , Bosu x10  stepping back onto ball  single leg stand on Thera pad  with ball toss to rebounder x 30  green   Back peddling 30 feet x 10 varying speed. Marland Kitchen   Hopping bilateral feet . LT leg more load than RT.  Discussed progression to single leg hopping with short hops lightly then add when  he has no pain with this lighter load. Discussed drop back and  lateral movements and start slow and add speed gradually with no pain.     Ankle Exercises: Aerobic   Elliptical L6 ramp 10 8 min   Tread Mill Stepper L3 5 min short and longer steps.    Ankle Exercises: Supine   Other Supine Ankle Exercises On reformer bilateral jumping cues for technique jumping bilaterally and with one foot  2 red springs and then single leg jumpiing  with one yellow spring  all 15-20 reps                PT Education - 03/02/15 0843    Education provided Yes   Education Details progression of jumping and running and sport psecific  activity   Person(s) Educated Patient   Methods Explanation   Comprehension Verbalized understanding          PT Short Term Goals - 03/02/15 0851    PT SHORT TERM GOAL #1   Title Patient to be independent with dynamic HEP for ROM and strengtening.    Status Achieved   PT SHORT TERM GOAL #2   Title Patient is to demon 10 degrees of rt ankle df. for gait mechanics.   Status Achieved           PT Long Term Goals - 03/02/15 1779    PT LONG TERM GOAL #1   Title Patient is to be independent with HEP with dynamic home an gym based exercises   Status Achieved   PT LONG TERM GOAL #2   Title Patient is to demo 15 degrees of rt knee dorsiflexion for running mechanics.   Baseline 10 degrees   Status Partially Met   PT LONG TERM GOAL #3   Title Patient to demo 5/5 strength with rt ankle inversion/eversion for stability on uneven surfaces.    Status Achieved   PT LONG TERM GOAL #4   Title Patient is to be able to throw a  football including rt foot plant and push for work related tasks.    Baseline He is not going full speed   Status Partially Met               Plan - 03/02/15 0844    Clinical Impression Statement Mr Coll tolerating hops and gentile jumping on reformer and should begin to start this on own and should do well oif he doe snot pudh too quickly running and jumping.    PT Next Visit Plan He should do well if progresses slowly with pain guidign. He needs mor ROM in nakle to minimize chance of reinjury.    Consulted and Agree with Plan of Care Patient        Problem List There are no active problems to display for this patient.   Darrel Hoover PT 03/02/2015, 9:00 AM  [facility] [address] [place], [place], [address] Phone: [phone]   Fax:  [phone]  Name: Curtis Thomas MRN: [id number] Date of Birth: [birth year]    PHYSICAL THERAPY DISCHARGE SUMMARY  Visits from Start of Care: 4  Current functional level related to goals / functional outcomes: See above.    Remaining deficits: He has not returned to full running and jumping and continues with decreased AROM RT ankle and weakness of plantarflexion.    Education / Equipment: HEP and progression. He will continue at gym and [place] and begin light football related activty that do not require running at this time. Asked him to be aware of pain and ease off if he has pain in surgical area.  Plan: Patient agrees to discharge.  Patient goals were met. Patient is being discharged due to financial reasons.  ?????
